# Patient Record
Sex: Female | Born: 1965 | Race: Black or African American | Hispanic: No | Marital: Single
Health system: Southern US, Community
[De-identification: ages and names within clinical notes are randomized; demographics above are authoritative.]

## PROBLEM LIST (undated history)

## (undated) DIAGNOSIS — I10 Essential (primary) hypertension: Secondary | ICD-10-CM

---

## 2000-05-07 ENCOUNTER — Emergency Department (HOSPITAL_COMMUNITY): Admission: EM | Admit: 2000-05-07 | Discharge: 2000-05-07 | Payer: Self-pay | Admitting: Internal Medicine

## 2002-05-23 ENCOUNTER — Emergency Department (HOSPITAL_COMMUNITY): Admission: EM | Admit: 2002-05-23 | Discharge: 2002-05-23 | Payer: Self-pay | Admitting: Internal Medicine

## 2003-01-24 ENCOUNTER — Emergency Department (HOSPITAL_COMMUNITY): Admission: EM | Admit: 2003-01-24 | Discharge: 2003-01-24 | Payer: Self-pay | Admitting: Emergency Medicine

## 2003-03-23 ENCOUNTER — Emergency Department (HOSPITAL_COMMUNITY): Admission: EM | Admit: 2003-03-23 | Discharge: 2003-03-24 | Payer: Self-pay | Admitting: *Deleted

## 2003-08-12 ENCOUNTER — Ambulatory Visit (HOSPITAL_COMMUNITY): Admission: RE | Admit: 2003-08-12 | Discharge: 2003-08-12 | Payer: Self-pay | Admitting: Family Medicine

## 2003-12-02 ENCOUNTER — Other Ambulatory Visit: Admission: RE | Admit: 2003-12-02 | Discharge: 2003-12-02 | Payer: Self-pay

## 2004-03-24 ENCOUNTER — Emergency Department (HOSPITAL_COMMUNITY): Admission: EM | Admit: 2004-03-24 | Discharge: 2004-03-24 | Payer: Self-pay | Admitting: Emergency Medicine

## 2005-08-09 ENCOUNTER — Other Ambulatory Visit: Admission: RE | Admit: 2005-08-09 | Discharge: 2005-08-09 | Payer: Self-pay | Admitting: Unknown Physician Specialty

## 2005-08-10 ENCOUNTER — Other Ambulatory Visit: Admission: RE | Admit: 2005-08-10 | Discharge: 2005-08-10 | Payer: Self-pay | Admitting: Unknown Physician Specialty

## 2006-07-14 ENCOUNTER — Ambulatory Visit: Payer: Self-pay | Admitting: Cardiology

## 2010-10-28 ENCOUNTER — Emergency Department (HOSPITAL_COMMUNITY): Payer: No Typology Code available for payment source

## 2010-10-28 ENCOUNTER — Observation Stay (HOSPITAL_COMMUNITY)
Admission: EM | Admit: 2010-10-28 | Discharge: 2010-10-30 | Disposition: A | Discharge status: Home / Self Care | Payer: No Typology Code available for payment source | Attending: Surgery | Admitting: Surgery

## 2010-10-28 ENCOUNTER — Encounter (HOSPITAL_COMMUNITY): Payer: Self-pay | Admitting: Radiology

## 2010-10-28 DIAGNOSIS — G8911 Acute pain due to trauma: Secondary | ICD-10-CM

## 2010-10-28 DIAGNOSIS — S301XXA Contusion of abdominal wall, initial encounter: Secondary | ICD-10-CM

## 2010-10-28 DIAGNOSIS — R109 Unspecified abdominal pain: Secondary | ICD-10-CM | POA: Insufficient documentation

## 2010-10-28 DIAGNOSIS — S36899A Unspecified injury of other intra-abdominal organs, initial encounter: Principal | ICD-10-CM | POA: Insufficient documentation

## 2010-10-28 DIAGNOSIS — S335XXA Sprain of ligaments of lumbar spine, initial encounter: Secondary | ICD-10-CM | POA: Insufficient documentation

## 2010-10-28 DIAGNOSIS — Y998 Other external cause status: Secondary | ICD-10-CM | POA: Insufficient documentation

## 2010-10-28 DIAGNOSIS — Z23 Encounter for immunization: Secondary | ICD-10-CM | POA: Insufficient documentation

## 2010-10-28 DIAGNOSIS — I10 Essential (primary) hypertension: Secondary | ICD-10-CM | POA: Insufficient documentation

## 2010-10-28 DIAGNOSIS — E876 Hypokalemia: Secondary | ICD-10-CM | POA: Insufficient documentation

## 2010-10-28 DIAGNOSIS — S139XXA Sprain of joints and ligaments of unspecified parts of neck, initial encounter: Secondary | ICD-10-CM

## 2010-10-28 DIAGNOSIS — R51 Headache: Secondary | ICD-10-CM | POA: Insufficient documentation

## 2010-10-28 HISTORY — DX: Essential (primary) hypertension: I10

## 2010-10-28 LAB — URINALYSIS, ROUTINE W REFLEX MICROSCOPIC
Glucose, UA: NEGATIVE mg/dL
Leukocytes, UA: NEGATIVE
Protein, ur: NEGATIVE mg/dL
Specific Gravity, Urine: 1.007 (ref 1.005–1.030)
pH: 6 (ref 5.0–8.0)

## 2010-10-28 LAB — URINE MICROSCOPIC-ADD ON

## 2010-10-28 LAB — POCT I-STAT, CHEM 8
BUN: 5 mg/dL — ABNORMAL LOW (ref 6–23)
Calcium, Ion: 1.09 mmol/L — ABNORMAL LOW (ref 1.12–1.32)
HCT: 44 % (ref 36.0–46.0)
Hemoglobin: 15 g/dL (ref 12.0–15.0)
Sodium: 144 mEq/L (ref 135–145)
TCO2: 21 mmol/L (ref 0–100)

## 2010-10-28 MED ORDER — IOHEXOL 300 MG/ML  SOLN
100.0000 mL | Freq: Once | INTRAMUSCULAR | Status: AC | PRN
  Administered 2010-10-28: 100 mL via INTRAVENOUS

## 2010-10-29 ENCOUNTER — Observation Stay (HOSPITAL_COMMUNITY): Payer: No Typology Code available for payment source

## 2010-10-29 DIAGNOSIS — I10 Essential (primary) hypertension: Secondary | ICD-10-CM

## 2010-10-29 LAB — BASIC METABOLIC PANEL
BUN: 8 mg/dL (ref 6–23)
CO2: 27 mEq/L (ref 19–32)
Glucose, Bld: 97 mg/dL (ref 70–99)
Potassium: 3 mEq/L — ABNORMAL LOW (ref 3.5–5.1)
Sodium: 140 mEq/L (ref 135–145)

## 2010-10-29 LAB — RAPID URINE DRUG SCREEN, HOSP PERFORMED
Amphetamines: NOT DETECTED
Opiates: NOT DETECTED

## 2010-10-29 LAB — CBC
HCT: 38.9 % (ref 36.0–46.0)
Hemoglobin: 13.5 g/dL (ref 12.0–15.0)
MCH: 32.1 pg (ref 26.0–34.0)
RBC: 4.21 MIL/uL (ref 3.87–5.11)

## 2010-10-29 LAB — DIFFERENTIAL
Basophils Relative: 0 % (ref 0–1)
Lymphocytes Relative: 44 % (ref 12–46)
Monocytes Absolute: 0.6 10*3/uL (ref 0.1–1.0)
Monocytes Relative: 9 % (ref 3–12)
Neutro Abs: 3 10*3/uL (ref 1.7–7.7)
Neutrophils Relative %: 45 % (ref 43–77)

## 2010-10-29 LAB — ETHANOL: Alcohol, Ethyl (B): 70 mg/dL — ABNORMAL HIGH (ref 0–11)

## 2010-10-30 LAB — CBC
MCH: 32.4 pg (ref 26.0–34.0)
MCV: 92.6 fL (ref 78.0–100.0)
Platelets: 236 10*3/uL (ref 150–400)
RDW: 13.5 % (ref 11.5–15.5)
WBC: 5.5 10*3/uL (ref 4.0–10.5)
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
## 2011-02-17 ENCOUNTER — Other Ambulatory Visit (HOSPITAL_COMMUNITY): Payer: Self-pay | Admitting: Family Medicine

## 2011-02-17 DIAGNOSIS — Z139 Encounter for screening, unspecified: Secondary | ICD-10-CM

## 2011-02-22 ENCOUNTER — Ambulatory Visit (HOSPITAL_COMMUNITY): Payer: No Typology Code available for payment source

## 2011-02-24 ENCOUNTER — Ambulatory Visit (HOSPITAL_COMMUNITY): Payer: No Typology Code available for payment source

## 2011-02-28 ENCOUNTER — Ambulatory Visit (HOSPITAL_COMMUNITY): Payer: No Typology Code available for payment source
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
## 2019-06-25 ENCOUNTER — Ambulatory Visit: Payer: Medicaid Other | Attending: Critical Care Medicine

## 2019-06-25 DIAGNOSIS — Z23 Encounter for immunization: Secondary | ICD-10-CM

## 2019-06-25 NOTE — Progress Notes (Signed)
   Covid-19 Vaccination Clinic  Name:  KATALEIA QUARANTA    MRN: [id number] DOB: [birth year]  06/25/2019  Ms. Loth was observed post Covid-19 immunization for 15 minutes without incident. She was provided with Vaccine Information Sheet and instruction to access the V-Safe system.   Ms. Srey was instructed to call 911 with any severe reactions post vaccine: Marland Kitchen Difficulty breathing  . Swelling of face and throat  . A fast heartbeat  . A bad rash all over body  . Dizziness and weakness   Immunizations Administered    Name Date Dose VIS Date Route   Moderna COVID-19 Vaccine 06/25/2019 10:38 AM 0.5 mL 12/2018 Intramuscular   Manufacturer: Moderna   Lot: [id number]   NDC: 86761-950-93

## 2019-07-23 ENCOUNTER — Ambulatory Visit: Payer: Medicaid Other | Attending: Critical Care Medicine

## 2019-07-23 DIAGNOSIS — Z23 Encounter for immunization: Secondary | ICD-10-CM

## 2019-07-23 NOTE — Progress Notes (Signed)
   Covid-19 Vaccination Clinic  Name:  NOMIE BUCHBERGER    MRN: [id number] DOB: 30-Oct-1965  07/23/2019  Ms. Briel was observed post Covid-19 immunization for 15 minutes without incident. She was provided with Vaccine Information Sheet and instruction to access the V-Safe system.   Ms. Konczal was instructed to call 911 with any severe reactions post vaccine: Marland Kitchen Difficulty breathing  . Swelling of face and throat  . A fast heartbeat  . A bad rash all over body  . Dizziness and weakness   Immunizations Administered    Name Date Dose VIS Date Route   Moderna COVID-19 Vaccine 07/23/2019 10:25 AM 0.5 mL 12/2018 Intramuscular   Manufacturer: Gala Murdoch   Lot: [id number]   NDC: 34917-915-05
